# Patient Record
Sex: Female | Born: 1980 | Race: White | Hispanic: No | Marital: Married | Smoking: Never smoker
Health system: Southern US, Community
[De-identification: ages and names within clinical notes are randomized; demographics above are authoritative.]

---

## 2015-12-24 ENCOUNTER — Other Ambulatory Visit: Payer: Self-pay | Admitting: Obstetrics & Gynecology

## 2015-12-24 ENCOUNTER — Other Ambulatory Visit (HOSPITAL_COMMUNITY)
Admission: RE | Admit: 2015-12-24 | Discharge: 2015-12-24 | Disposition: A | Discharge status: Home / Self Care | Payer: 59 | Source: Ambulatory Visit | Attending: Obstetrics & Gynecology | Admitting: Obstetrics & Gynecology

## 2015-12-24 DIAGNOSIS — Z1151 Encounter for screening for human papillomavirus (HPV): Secondary | ICD-10-CM | POA: Diagnosis present

## 2015-12-24 DIAGNOSIS — Z01419 Encounter for gynecological examination (general) (routine) without abnormal findings: Secondary | ICD-10-CM | POA: Insufficient documentation

## 2015-12-28 LAB — CYTOLOGY - PAP
Diagnosis: NEGATIVE
HPV: NOT DETECTED

## 2018-10-07 ENCOUNTER — Other Ambulatory Visit: Payer: Self-pay

## 2018-10-07 DIAGNOSIS — Z20822 Contact with and (suspected) exposure to covid-19: Secondary | ICD-10-CM

## 2018-10-08 LAB — NOVEL CORONAVIRUS, NAA: SARS-CoV-2, NAA: NOT DETECTED

## 2019-01-20 ENCOUNTER — Other Ambulatory Visit: Payer: Self-pay | Admitting: Obstetrics & Gynecology

## 2019-01-20 DIAGNOSIS — Z1231 Encounter for screening mammogram for malignant neoplasm of breast: Secondary | ICD-10-CM

## 2019-03-22 ENCOUNTER — Ambulatory Visit: Payer: Self-pay | Attending: Internal Medicine

## 2019-03-22 DIAGNOSIS — Z23 Encounter for immunization: Secondary | ICD-10-CM | POA: Insufficient documentation

## 2019-03-22 NOTE — Progress Notes (Signed)
   Covid-19 Vaccination Clinic  Name:  Adrienne Dawson    MRN: [id number] DOB: 10-07-1980  03/22/2019  [name] was observed post Covid-19 immunization for 15 minutes without incidence. She was provided with Vaccine Information Sheet and instruction to access the V-Safe system.   [name] was instructed to call 911 with any severe reactions post vaccine: Marland Kitchen Difficulty breathing  . Swelling of your face and throat  . A fast heartbeat  . A bad rash all over your body  . Dizziness and weakness    Immunizations Administered    Name Date Dose VIS Date Route   Pfizer COVID-19 Vaccine 03/22/2019  9:46 AM 0.3 mL 01/03/2019 Intramuscular   Manufacturer: [employer], [employer]   Lot: [id number]   NDC: [address]-1

## 2019-04-12 ENCOUNTER — Ambulatory Visit: Payer: Self-pay | Attending: Internal Medicine

## 2019-04-12 DIAGNOSIS — Z23 Encounter for immunization: Secondary | ICD-10-CM

## 2019-04-12 NOTE — Progress Notes (Signed)
   Covid-19 Vaccination Clinic  Name:  Adrienne Dawson    MRN: [id number] DOB: 1981/01/09  04/12/2019  [name] was observed post Covid-19 immunization for 15 minutes without incident. She was provided with Vaccine Information Sheet and instruction to access the V-Safe system.   [name] was instructed to call 911 with any severe reactions post vaccine: Marland Kitchen Difficulty breathing  . Swelling of face and throat  . A fast heartbeat  . A bad rash all over body  . Dizziness and weakness   Immunizations Administered    Name Date Dose VIS Date Route   Pfizer COVID-19 Vaccine 04/12/2019 10:51 AM 0.3 mL 01/03/2019 Intramuscular   Manufacturer: [employer], [employer]   Lot: [id number]   NDC: [address]-6

## 2019-04-16 ENCOUNTER — Ambulatory Visit: Payer: Self-pay

## 2019-04-21 ENCOUNTER — Ambulatory Visit: Payer: Self-pay

## 2019-04-24 ENCOUNTER — Encounter: Payer: Self-pay | Admitting: Podiatry

## 2019-04-24 ENCOUNTER — Ambulatory Visit (INDEPENDENT_AMBULATORY_CARE_PROVIDER_SITE_OTHER): Payer: Self-pay

## 2019-04-24 ENCOUNTER — Telehealth: Payer: Self-pay | Admitting: Podiatry

## 2019-04-24 ENCOUNTER — Ambulatory Visit: Payer: Self-pay | Admitting: Podiatry

## 2019-04-24 ENCOUNTER — Other Ambulatory Visit: Payer: Self-pay | Admitting: Podiatry

## 2019-04-24 ENCOUNTER — Other Ambulatory Visit: Payer: Self-pay

## 2019-04-24 VITALS — Temp 97.6°F | Ht 68.5 in | Wt 190.0 lb

## 2019-04-24 DIAGNOSIS — M722 Plantar fascial fibromatosis: Secondary | ICD-10-CM

## 2019-04-24 DIAGNOSIS — M79671 Pain in right foot: Secondary | ICD-10-CM

## 2019-04-24 DIAGNOSIS — M79672 Pain in left foot: Secondary | ICD-10-CM

## 2019-04-24 MED ORDER — DICLOFENAC SODIUM 75 MG PO TBEC
75.0000 mg | DELAYED_RELEASE_TABLET | Freq: Two times a day (BID) | ORAL | 2 refills | Status: AC
Start: 2019-04-24 — End: ?

## 2019-04-24 NOTE — Progress Notes (Signed)
   Subjective:    Patient ID: Adrienne Dawson, female    DOB: 1980/07/02, 39 y.o.   MRN: [id number]  HPI    Review of Systems  All other systems reviewed and are negative.      Objective:   Physical Exam        Assessment & Plan:

## 2019-04-24 NOTE — Telephone Encounter (Signed)
Pt called stating she was just here and had to rush out due to another appt. Not our fault because she scheduled another appt somewhere else. But was to be getting a brace for her left foot(Plantar fascittis).She is going to come Monday to get it put on for her. She is aware of out lunch hours.Marland KitchenMarland Kitchen

## 2019-04-24 NOTE — Patient Instructions (Signed)

## 2019-04-24 NOTE — Progress Notes (Signed)
Subjective:   Patient ID: Adrienne Dawson, female   DOB: 39 y.o.   MRN: [id number]   HPI Patient presents with painful heel pain left over right stating this is been going on now for about 6 months and gradually becoming more active.  Worse in the morning after periods of sitting and is become increasingly painful for her especially when trying to stand up.  Patient does not smoke likes to be active   Review of Systems  All other systems reviewed and are negative.       Objective:  Physical Exam Vitals and nursing note reviewed.  Constitutional:      Appearance: She is well-developed.  Pulmonary:     Effort: Pulmonary effort is normal.  Musculoskeletal:        General: Normal range of motion.  Skin:    General: Skin is warm.  Neurological:     Mental Status: She is alert.     Neurovascular status intact muscle strength adequate range of motion within normal limits.  Patient is found to have exquisite discomfort plantar aspect heel left over right with inflammation fluid of the medial band is noted to have moderate depression of the arch bilateral.  Patient has good digital perfusion well oriented x3     Assessment:  Acute plantar fasciitis bilateral with inflammation fluid of the medial band     Plan:  H&P x-rays reviewed and today I went ahead did sterile prep injected the plantar fascial bilateral 3 mg Kenalog 5 mg liken applied fascial brace left instructed on physical therapy anti-inflammatory supportive shoe gear.  Reappoint to recheck  X-rays indicate minimal spur formation no indication stress fracture arthritis

## 2019-07-03 ENCOUNTER — Other Ambulatory Visit: Payer: Self-pay

## 2019-07-03 ENCOUNTER — Ambulatory Visit
Admission: RE | Admit: 2019-07-03 | Discharge: 2019-07-03 | Disposition: A | Discharge status: Home / Self Care | Payer: No Typology Code available for payment source | Source: Ambulatory Visit | Attending: Obstetrics & Gynecology | Admitting: Obstetrics & Gynecology

## 2019-07-03 DIAGNOSIS — Z1231 Encounter for screening mammogram for malignant neoplasm of breast: Secondary | ICD-10-CM

## 2019-11-08 ENCOUNTER — Ambulatory Visit: Payer: No Typology Code available for payment source | Attending: Internal Medicine

## 2019-11-08 DIAGNOSIS — Z23 Encounter for immunization: Secondary | ICD-10-CM

## 2019-11-08 NOTE — Progress Notes (Signed)
   Covid-19 Vaccination Clinic  Name:  Annalisia Ingber    MRN: [id number] DOB: 20-Oct-1980  11/08/2019  Ms. Zollner was observed post Covid-19 immunization for 15 minutes without incident. She was provided with Vaccine Information Sheet and instruction to access the V-Safe system.   Ms. Oravec was instructed to call 911 with any severe reactions post vaccine: Marland Kitchen Difficulty breathing  . Swelling of face and throat  . A fast heartbeat  . A bad rash all over body  . Dizziness and weakness

## 2020-05-19 ENCOUNTER — Other Ambulatory Visit: Payer: Self-pay | Admitting: Obstetrics and Gynecology

## 2020-05-19 ENCOUNTER — Other Ambulatory Visit: Payer: Self-pay | Admitting: Obstetrics & Gynecology

## 2020-05-19 DIAGNOSIS — Z1231 Encounter for screening mammogram for malignant neoplasm of breast: Secondary | ICD-10-CM

## 2020-07-08 ENCOUNTER — Ambulatory Visit
Admission: RE | Admit: 2020-07-08 | Discharge: 2020-07-08 | Disposition: A | Discharge status: Home / Self Care | Payer: 59 | Source: Ambulatory Visit | Attending: Obstetrics and Gynecology | Admitting: Obstetrics and Gynecology

## 2020-07-08 ENCOUNTER — Other Ambulatory Visit: Payer: Self-pay

## 2020-07-08 DIAGNOSIS — Z1231 Encounter for screening mammogram for malignant neoplasm of breast: Secondary | ICD-10-CM

## 2020-10-01 IMAGING — MG DIGITAL SCREENING BILAT W/ TOMO W/ CAD
8 series · 8 of 24 positions shown · non-contrast
Comparison: None.

CLINICAL DATA: Screening.

EXAM:
DIGITAL SCREENING BILATERAL MAMMOGRAM WITH TOMO AND CAD

[L MLO synth-2D]
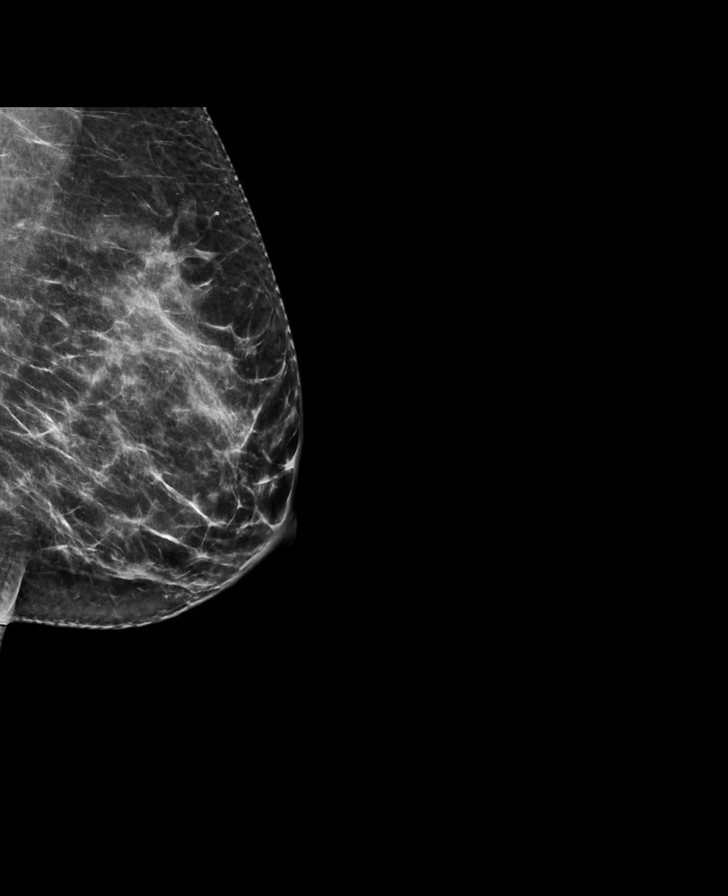

[R MLO synth-2D]
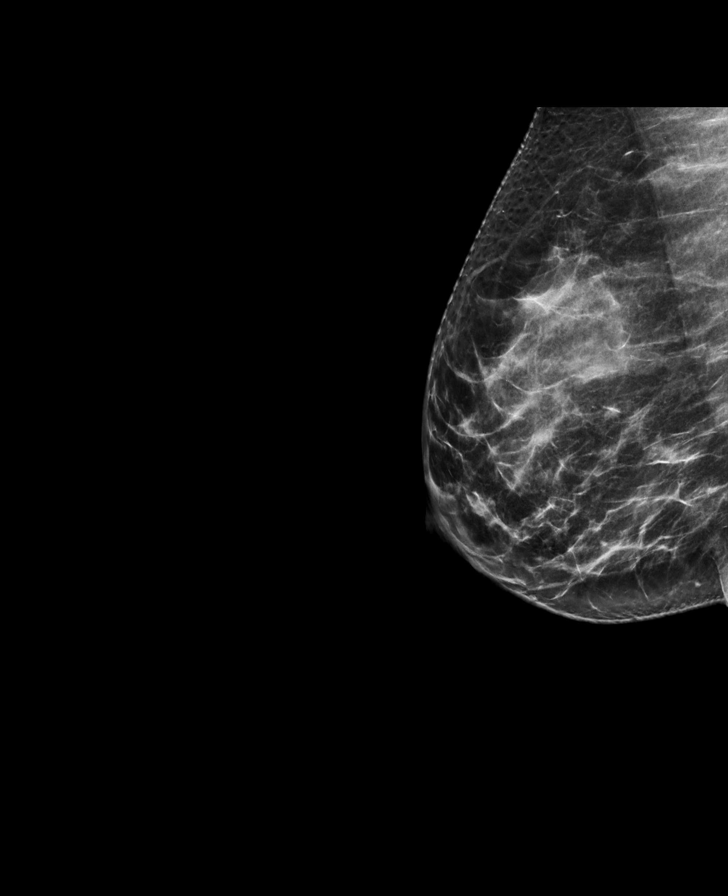

[R CC synth-2D]
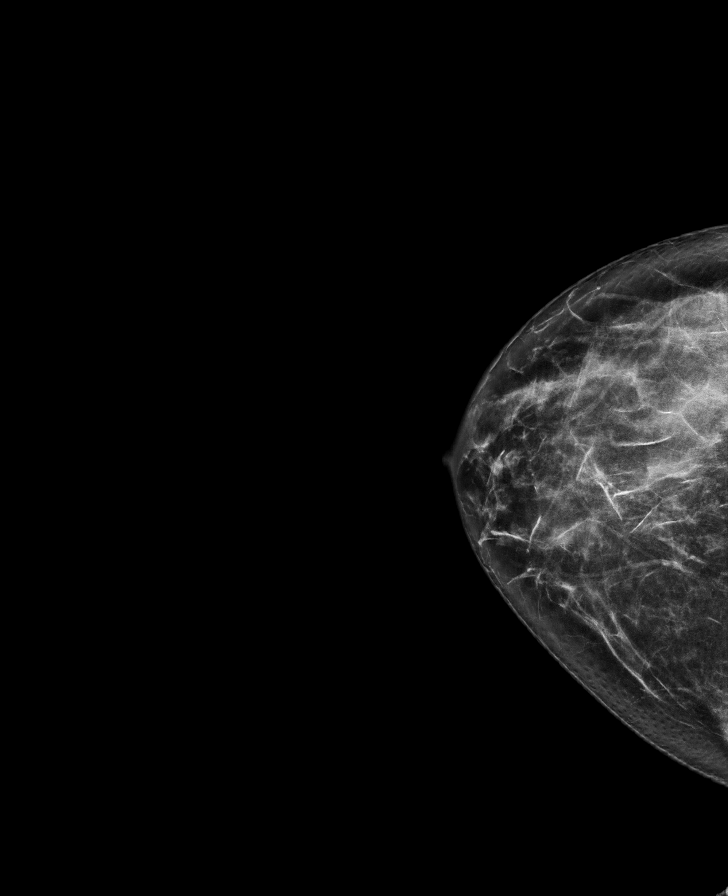

[L CC synth-2D]
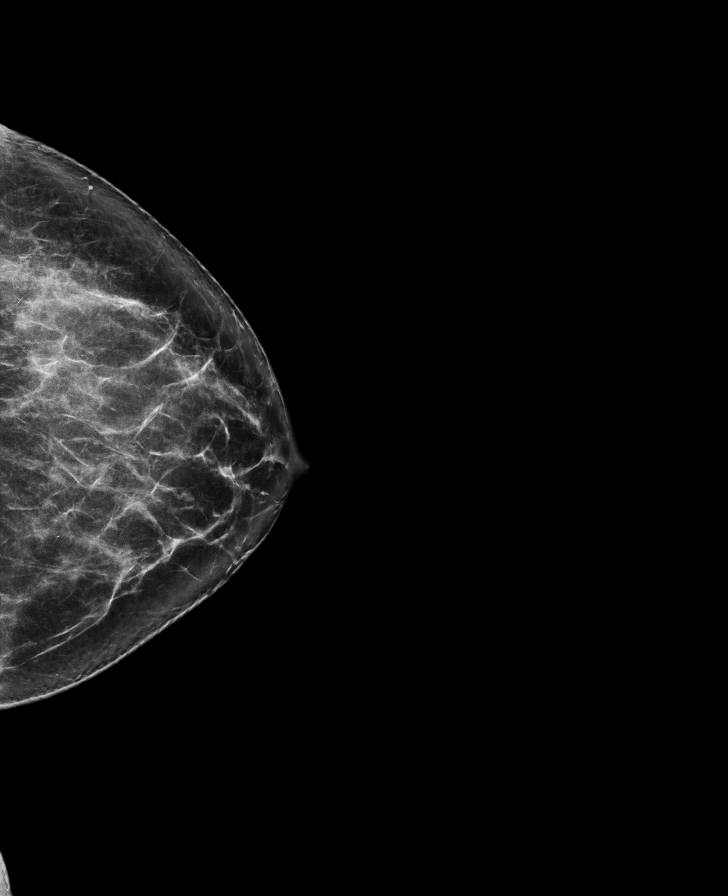

[R CC tomo · tomo slice 35/69.0]
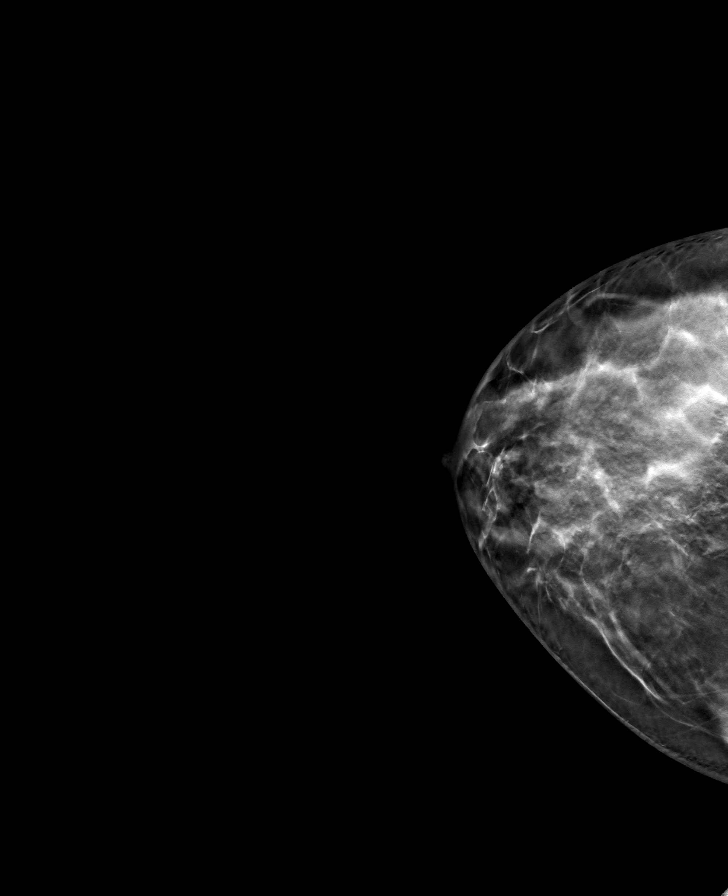

[L CC tomo · tomo slice 38/75.0]
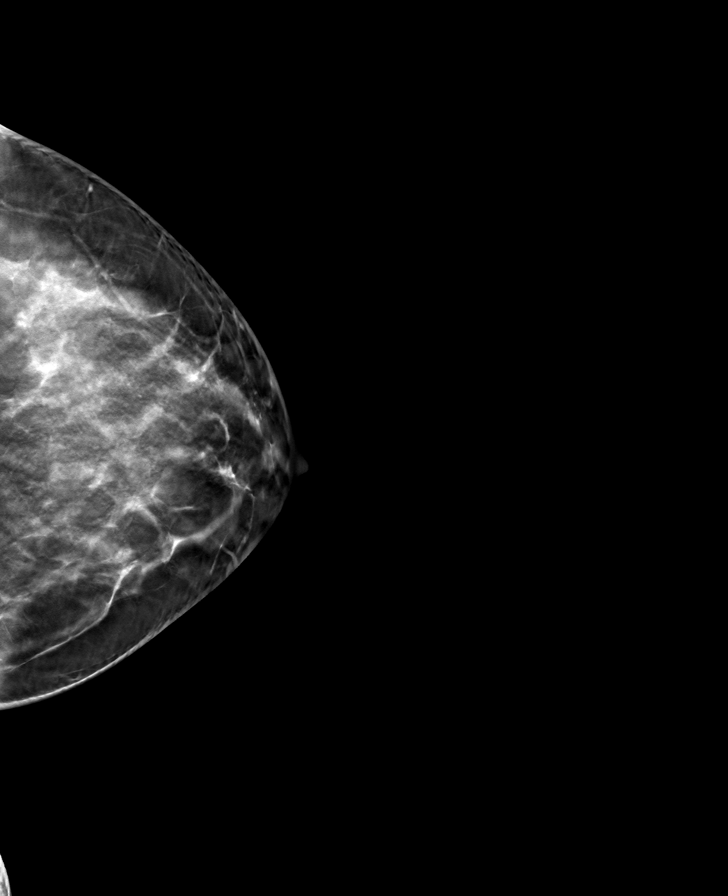

[L MLO tomo · tomo slice 37/74.0]
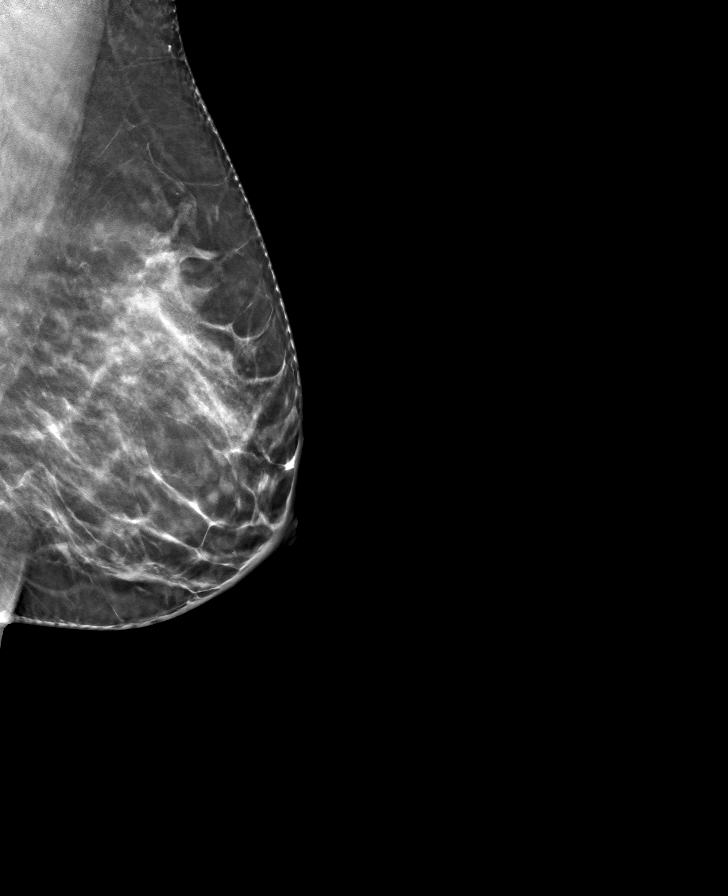

[R MLO tomo · tomo slice 37/74.0]
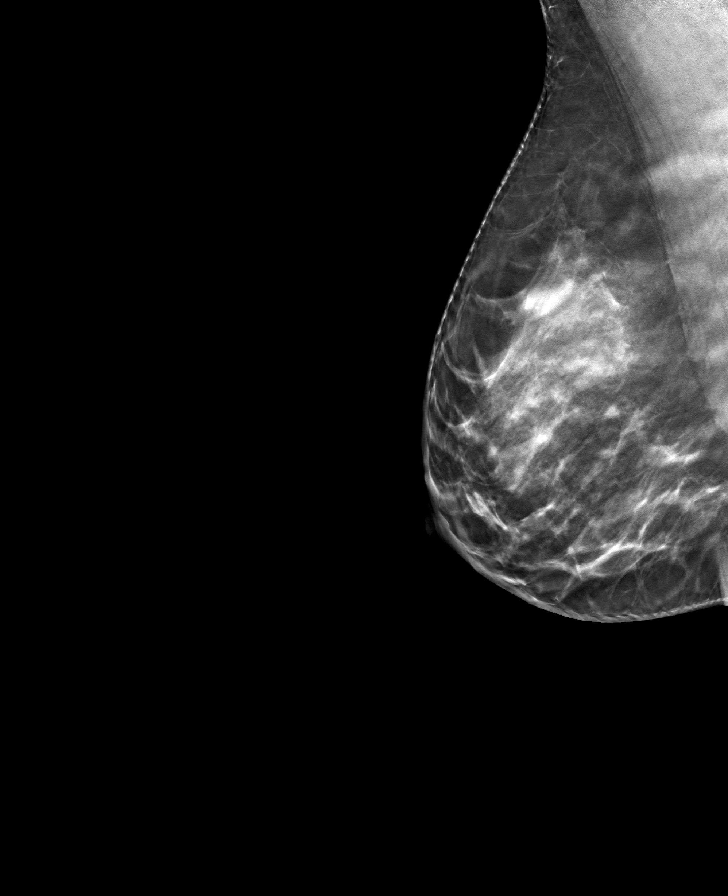

[8 of 24 positions shown; findings below may reference images not displayed]

ACR Breast Density Category c: The breast tissue is heterogeneously
dense, which may obscure small masses
FINDINGS: There are no findings suspicious for malignancy. Images were
processed with CAD.
IMPRESSION: No mammographic evidence of malignancy. A result letter of this
screening mammogram will be mailed directly to the patient.

RECOMMENDATION:
Screening mammogram in one year. (Code:EM-[id number])

BI-RADS CATEGORY  1: Negative.

## 2021-04-25 ENCOUNTER — Other Ambulatory Visit (HOSPITAL_COMMUNITY)
Admission: RE | Admit: 2021-04-25 | Discharge: 2021-04-25 | Disposition: A | Discharge status: Home / Self Care | Payer: PRIVATE HEALTH INSURANCE | Source: Ambulatory Visit | Attending: Obstetrics and Gynecology | Admitting: Obstetrics and Gynecology

## 2021-04-25 ENCOUNTER — Other Ambulatory Visit: Payer: Self-pay | Admitting: Obstetrics and Gynecology

## 2021-04-25 DIAGNOSIS — Z01419 Encounter for gynecological examination (general) (routine) without abnormal findings: Secondary | ICD-10-CM | POA: Diagnosis not present

## 2021-04-27 LAB — CYTOLOGY - PAP
Comment: NEGATIVE
Comment: NEGATIVE
Comment: NEGATIVE
Diagnosis: NEGATIVE
HPV 16: NEGATIVE
HPV 18 / 45: NEGATIVE
High risk HPV: POSITIVE — AB

## 2022-05-02 ENCOUNTER — Other Ambulatory Visit (HOSPITAL_COMMUNITY)
Admission: RE | Admit: 2022-05-02 | Discharge: 2022-05-02 | Disposition: A | Discharge status: Home / Self Care | Payer: PRIVATE HEALTH INSURANCE | Source: Ambulatory Visit | Attending: Obstetrics and Gynecology | Admitting: Obstetrics and Gynecology

## 2022-05-02 ENCOUNTER — Other Ambulatory Visit: Payer: Self-pay | Admitting: Obstetrics and Gynecology

## 2022-05-02 DIAGNOSIS — Z01419 Encounter for gynecological examination (general) (routine) without abnormal findings: Secondary | ICD-10-CM | POA: Diagnosis present

## 2022-05-08 LAB — CYTOLOGY - PAP
Comment: NEGATIVE
Diagnosis: NEGATIVE
High risk HPV: NEGATIVE
